# Patient Record
Sex: Male | Born: 1965 | Race: White | Hispanic: No | Smoking: Never smoker
Health system: Southern US, Community
[De-identification: ages and names within clinical notes are randomized; demographics above are authoritative.]

## PROBLEM LIST (undated history)

## (undated) DIAGNOSIS — M109 Gout, unspecified: Secondary | ICD-10-CM

## (undated) DIAGNOSIS — I10 Essential (primary) hypertension: Secondary | ICD-10-CM

## (undated) DIAGNOSIS — G4710 Hypersomnia, unspecified: Secondary | ICD-10-CM

## (undated) DIAGNOSIS — N200 Calculus of kidney: Secondary | ICD-10-CM

## (undated) DIAGNOSIS — E7800 Pure hypercholesterolemia, unspecified: Secondary | ICD-10-CM

## (undated) DIAGNOSIS — G4733 Obstructive sleep apnea (adult) (pediatric): Secondary | ICD-10-CM

## (undated) DIAGNOSIS — K219 Gastro-esophageal reflux disease without esophagitis: Secondary | ICD-10-CM

## (undated) HISTORY — PX: ESOPHAGEAL DILATION: SHX303

## (undated) HISTORY — DX: Gout, unspecified: M10.9

## (undated) HISTORY — DX: Calculus of kidney: N20.0

## (undated) HISTORY — DX: Obstructive sleep apnea (adult) (pediatric): G47.33

## (undated) HISTORY — DX: Hypersomnia, unspecified: G47.10

---

## 2000-05-23 ENCOUNTER — Emergency Department (HOSPITAL_COMMUNITY): Admission: EM | Admit: 2000-05-23 | Discharge: 2000-05-23 | Payer: Self-pay | Admitting: Emergency Medicine

## 2000-05-23 ENCOUNTER — Encounter: Payer: Self-pay | Admitting: Emergency Medicine

## 2000-05-25 ENCOUNTER — Encounter (INDEPENDENT_AMBULATORY_CARE_PROVIDER_SITE_OTHER): Payer: Self-pay

## 2000-05-25 ENCOUNTER — Other Ambulatory Visit: Admission: RE | Admit: 2000-05-25 | Discharge: 2000-05-25 | Payer: Self-pay | Admitting: Urology

## 2004-05-24 ENCOUNTER — Emergency Department (HOSPITAL_COMMUNITY): Admission: EM | Admit: 2004-05-24 | Discharge: 2004-05-24 | Payer: Self-pay | Admitting: Emergency Medicine

## 2004-05-27 ENCOUNTER — Ambulatory Visit (HOSPITAL_COMMUNITY): Admission: RE | Admit: 2004-05-27 | Discharge: 2004-05-27 | Payer: Self-pay | Admitting: Orthopedic Surgery

## 2004-05-30 ENCOUNTER — Ambulatory Visit (HOSPITAL_COMMUNITY): Admission: RE | Admit: 2004-05-30 | Discharge: 2004-05-30 | Payer: Self-pay | Admitting: Orthopedic Surgery

## 2006-11-29 ENCOUNTER — Ambulatory Visit (HOSPITAL_COMMUNITY): Admission: RE | Admit: 2006-11-29 | Discharge: 2006-11-29 | Payer: Self-pay | Admitting: Family Medicine

## 2010-05-30 ENCOUNTER — Encounter: Payer: Self-pay | Admitting: Orthopedic Surgery

## 2010-09-25 NOTE — Op Note (Signed)
NAMEGENESIS, PAGET NO.:  [id number]   MEDICAL RECORD NO.:  [id number]          PATIENT TYPE:  OIB   LOCATION:  2899                         FACILITY:  [facility]   PHYSICIAN:  Dionne Ano. Gramig III, M.D.DATE OF BIRTH:  29-Aug-1965   DATE OF PROCEDURE:  DATE OF DISCHARGE:                                 OPERATIVE REPORT   PREOPERATIVE DIAGNOSIS:  Comminuted, complex, intra-articular PIP joint  fracture about the left small finger with metaphyseal defect.   POSTOPERATIVE DIAGNOSIS:  Comminuted, complex, intra-articular proximal  interphalangeal joint fracture about the left small finger with metaphyseal  defect.   PROCEDURE:  1.  Open reduction internal fixation with bone graft (mini-Mig), left small      finger, proximal interphalangeal interarticular fracture.  2.  Dynamic external fixator apparatus application to the small finger (this      was application of a Dynamic external fixation device which was a      distinct and separate portion of the procedure).  3.  Stress radiography.   SURGEON:  Dionne Ano. Amanda Pea, M.D.   ASSISTANT:  None.   COMPLICATIONS:  None.   ANESTHESIA:  General.   TOURNIQUET TIME:  Less than 30 minutes.   ESTIMATED BLOOD LOSS:  Minimal.   INDICATIONS FOR PROCEDURE:  This patient is a very pleasant, 45 year old  male who presents for the above-mentioned diagnosis.  After counseling  regarding the risks and benefits of surgery, including risks to the patient  of infection, bleeding, anesthesia, damage to normal structures and failure  of the surgery to accomplished these intended goals, including relieving  symptoms and restoring function; with this in mind, he desires to proceed.  All questions have been encouraged and answered preoperatively.   OPERATIVE FINDINGS/PROCEDURE:  Mr. Cordial was seen in the preop holding area,  consent was signed.  Following this, he was  taken to the operating suite,  preoperative antibiotics were  given, he was laid supine, given a general  anesthetic under the direction of Dr. Gypsy Balsam.  Once this ws done, he was  prepped and draped in the usual sterile fashion about the left small finger.  Following this, the patient then underwent sterile field application and  fluoroscopy of the finger was accomplished. This was stress radiography  revealing the fracture which was interarticular and comminuted. It was quite  apparent that this was a very severe fracture and correlated well with his  plain film x-rays and CT scan.   At this time, I placed a 0.045 long, K-wire at the epicenter of his proximal  phalanx, condylar head under stress radiography.  I placed a second pin  distal in the middle phalanx towards the condylar surface. Once this was  done, I then bent these to 90 degrees and then bent the most proximal pin  to conform to the parameters for Dynamic extra fix.  The patient was bent at  120, then 60 degrees at the distal end of the most proximal pin to allow for  engagement of the distal pin. These were then bent at 90 degrees, hooked  together  as described for the external fixator technique, and allowed for  external fixation with a Dynamic component that would allow full flexion and  extension.  I was pleased with this. All looked well and lined up nicely  radiographically. This was a 0.045 K-wire which was used, of course.   Once this was done, the patient had a small incision made under tourniquet  control dorsally about the middle phalanx. I entered a pilot hole with the  0.062 K-wire and then using a blunt-tipped 0.045 K-wire, performed reduction  of the fracture. Following this, I mixed mini-Mig bone graft which is  synthetic calcium sulfate and filled the gap and metaphyseal defect with  this.  Thus, ORIF with bone grafting utilizing mini-Mig synthetic graft and  external fixation was applied.  The stress radiography afterwards showed  excellent position. The tourniquet was  deflated, hemostasis was obtained.  The wound was closed. A pin cap was then placed, and Xeroform as well as  Neosporin were placed.  I placed a Marcaine block for postop analgesia and  following this, the patient was transferred to recovery room where he was  noted to be in stable condition.   He will be monitored and discharged home on Keflex, Percocet, Robaxin.  Follow-up in 4-5 days.  Notify me if he has any problems occur. I gave him  an additional g of Ancef in the recovery room, and asked him to notify me  should any problems, questions or concerns arise.  All questions have been  encouraged and answered.       WMG/MEDQ  D:  05/30/2004  T:  05/30/2004  Job:  [id number]

## 2011-09-04 ENCOUNTER — Emergency Department (HOSPITAL_COMMUNITY)
Admission: EM | Admit: 2011-09-04 | Discharge: 2011-09-04 | Disposition: A | Discharge status: Home / Self Care | Payer: BC Managed Care – PPO | Attending: Emergency Medicine | Admitting: Emergency Medicine

## 2011-09-04 ENCOUNTER — Encounter (HOSPITAL_COMMUNITY): Payer: Self-pay | Admitting: *Deleted

## 2011-09-04 DIAGNOSIS — W261XXA Contact with sword or dagger, initial encounter: Secondary | ICD-10-CM | POA: Insufficient documentation

## 2011-09-04 DIAGNOSIS — S61209A Unspecified open wound of unspecified finger without damage to nail, initial encounter: Secondary | ICD-10-CM | POA: Insufficient documentation

## 2011-09-04 DIAGNOSIS — I10 Essential (primary) hypertension: Secondary | ICD-10-CM | POA: Insufficient documentation

## 2011-09-04 DIAGNOSIS — W260XXA Contact with knife, initial encounter: Secondary | ICD-10-CM | POA: Insufficient documentation

## 2011-09-04 DIAGNOSIS — K219 Gastro-esophageal reflux disease without esophagitis: Secondary | ICD-10-CM | POA: Insufficient documentation

## 2011-09-04 DIAGNOSIS — IMO0002 Reserved for concepts with insufficient information to code with codable children: Secondary | ICD-10-CM

## 2011-09-04 DIAGNOSIS — E789 Disorder of lipoprotein metabolism, unspecified: Secondary | ICD-10-CM | POA: Insufficient documentation

## 2011-09-04 HISTORY — DX: Pure hypercholesterolemia, unspecified: E78.00

## 2011-09-04 HISTORY — DX: Essential (primary) hypertension: I10

## 2011-09-04 HISTORY — DX: Gastro-esophageal reflux disease without esophagitis: K21.9

## 2011-09-04 NOTE — Discharge Instructions (Signed)
Laceration Care, Adult °A laceration is a cut that goes through all layers of the skin. The cut goes into the tissue beneath the skin. °HOME CARE °For stitches (sutures) or staples: °· Keep the cut clean and dry.  °· If you have a bandage (dressing), change it at least once a day. Change the bandage if it gets wet or dirty, or as told by your doctor.  °· Wash the cut with soap and water 2 times a day. Rinse the cut with water. Pat it dry with a clean towel.  °· Put a thin layer of medicated cream on the cut as told by your doctor.  °· You may shower after the first 24 hours. Do not soak the cut in water until the stitches are removed.  °· Only take medicines as told by your doctor.  °· Have your stitches or staples removed as told by your doctor.  °For skin adhesive strips: °· Keep the cut clean and dry.  °· Do not get the strips wet. You may take a bath, but be careful to keep the cut dry.  °· If the cut gets wet, pat it dry with a clean towel.  °· The strips will fall off on their own. Do not remove the strips that are still stuck to the cut.  °For wound glue: °· You may shower or take baths. Do not soak or scrub the cut. Do not swim. Avoid heavy sweating until the glue falls off on its own. After a shower or bath, pat the cut dry with a clean towel.  °· Do not put medicine on your cut until the glue falls off.  °· If you have a bandage, do not put tape over the glue.  °· Avoid lots of sunlight or tanning lamps until the glue falls off. Put sunscreen on the cut for the first year to reduce your scar.  °· The glue will fall off on its own. Do not pick at the glue.  °You may need a tetanus shot if: °· You cannot remember when you had your last tetanus shot.  °· You have never had a tetanus shot.  °If you need a tetanus shot and you choose not to have one, you may get tetanus. Sickness from tetanus can be serious. °GET HELP RIGHT AWAY IF:  °· Your pain does not get better with medicine.  °· Your arm, hand, leg, or  foot loses feeling (numbness) or changes color.  °· Your cut is bleeding.  °· Your joint feels weak, or you cannot use your joint.  °· You have painful lumps on your body.  °· Your cut is red, puffy (swollen), or painful.  °· You have a red line on the skin near the cut.  °· You have yellowish-white fluid (pus) coming from the cut.  °· You have a fever.  °· You have a bad smell coming from the cut or bandage.  °· Your cut breaks open before or after stitches are removed.  °· You notice something coming out of the cut, such as wood or glass.  °· You cannot move a finger or toe.  °MAKE SURE YOU:  °· Understand these instructions.  °· Will watch your condition.  °· Will get help right away if you are not doing well or get worse.  °Document Released: 10/13/2007 Document Revised: 04/15/2011 Document Reviewed: 10/20/2010 °Exit[employer]® Patient Information ©2012 [employer]. °

## 2011-09-04 NOTE — ED Notes (Signed)
Cut right ring finger with box cutter.  Bleeding controlled.

## 2011-09-04 NOTE — ED Provider Notes (Signed)
This chart was scribed for [employer]. Colon Branch, MD by Wallis Mart. The patient was seen in room APA03/APA03 and the patient's care was started at 6:55 PM.    CSN: [id number]  Arrival date & time 09/04/11  1827   First MD Initiated Contact with Patient 09/04/11 1840      Chief Complaint  Patient presents with  . Extremity Laceration    (Consider location/radiation/quality/duration/timing/severity/associated sxs/prior treatment) HPI Stephen Sellers is a 46 y.o. male who presents to the Emergency Department complaining of a laceration to right ring finger onset this afternoon. Pt states that he cut his finger on a box cutter while cutting panelling. There are no other associated symptoms and no other alleviating or aggravating factors.    Past Medical History  Diagnosis Date  . Acid reflux   . High cholesterol   . Hypertension     History reviewed. No pertinent past surgical history.  No family history on file.  History  Substance Use Topics  . Smoking status: Never Smoker   . Smokeless tobacco: Not on file  . Alcohol Use: No      Review of Systems 10 Systems reviewed and all are negative for acute change except as noted in the HPI.   Allergies  Review of patient's allergies indicates no known allergies.  Home Medications  No current outpatient prescriptions on file.  BP 114/48  Pulse 54  Temp(Src) 98.1 F (36.7 C) (Oral)  Resp 16  Ht 5\' 5"  (1.651 m)  Wt 160 lb (72.576 kg)  BMI 26.63 kg/m2  SpO2 96%  Physical Exam  Constitutional: He is oriented to person, place, and time. He appears well-developed and well-nourished.  Eyes: Pupils are equal, round, and reactive to light.  Neck: Normal range of motion.  Musculoskeletal:       10 mm laceration to right ring finger that starts at edge of nail and goes around half way to pad of finger, 6 mm laceration to bottom of right ring finger, lacerations are not deep  Neurological: He is alert and oriented to person,  place, and time.  Psychiatric: He has a normal mood and affect. His behavior is normal.    ED Course  Procedures (including critical care time) DIAGNOSTIC STUDIES: Oxygen Saturation is 96% on room air, normal by my interpretation.    COORDINATION OF CARE:  7:12 PM: EDP at bedside to recheck laceration after it has soaked.       MDM  Patient was to lacerations to the ring finger from using a box cutter. Tetanus is up-to-date. Dermabond and Steri-Strips placed by nursing.Pt stable in ED with no significant deterioration in condition.The patient appears reasonably screened and/or stabilized for discharge and I doubt any other medical condition or other [facility] requiring further screening, evaluation, or treatment in the ED at this time prior to discharge.  I personally performed the services described in this documentation, which was scribed in my presence. The recorded information has been reviewed and considered.   MDM Reviewed: nursing note and vitals           Nicoletta Dress. Colon Branch, MD 09/04/11 1929

## 2011-09-04 NOTE — ED Notes (Signed)
Discharge instructions reviewed with pt, questions answered. Pt verbalized understanding.  

## 2017-01-31 ENCOUNTER — Ambulatory Visit (INDEPENDENT_AMBULATORY_CARE_PROVIDER_SITE_OTHER): Payer: BLUE CROSS/BLUE SHIELD | Admitting: Family Medicine

## 2017-01-31 ENCOUNTER — Encounter: Payer: Self-pay | Admitting: Family Medicine

## 2017-01-31 VITALS — BP 120/80 | HR 56 | Ht 65.0 in | Wt 149.6 lb

## 2017-01-31 DIAGNOSIS — Z87442 Personal history of urinary calculi: Secondary | ICD-10-CM | POA: Diagnosis not present

## 2017-01-31 DIAGNOSIS — E785 Hyperlipidemia, unspecified: Secondary | ICD-10-CM | POA: Diagnosis not present

## 2017-01-31 DIAGNOSIS — M109 Gout, unspecified: Secondary | ICD-10-CM | POA: Insufficient documentation

## 2017-01-31 DIAGNOSIS — Z8739 Personal history of other diseases of the musculoskeletal system and connective tissue: Secondary | ICD-10-CM

## 2017-01-31 DIAGNOSIS — K219 Gastro-esophageal reflux disease without esophagitis: Secondary | ICD-10-CM | POA: Diagnosis not present

## 2017-01-31 MED ORDER — ESOMEPRAZOLE MAGNESIUM 40 MG PO CPDR: 40.0000 mg | DELAYED_RELEASE_CAPSULE | Freq: Every day | ORAL | 3 refills | Status: AC

## 2017-01-31 NOTE — Progress Notes (Signed)
   Subjective:    Patient ID: Stephen Sellers, male    DOB: Dec 11, 1965, 51 y.o.   MRN: [id number]  HPI Chief Complaint  Patient presents with  . new pt    new pt get established. needs a refill on nexium. will get flu shot at work   He is new to the practice and here to establish care.  Previous medical care: Dr. Margo Aye in [place]. States he just moved to [place].   History of GERD and esophageal stricture in the past and had his esophagus dilated years ago. No difficulty swallowing since. States he takes Nexium daily or reflux is unmanageable. GI- Dr. Kinnie Scales.   States he eats sweets and drinks a lot of coffee. Diet is unhealthy.   2 step daughters. Currently separated. Works for [employer] as [employer].   History of gout attacks-last one was 2 years ago. States he avoids shellfish. Denies taking preventive medication for this. He has taken colchicine in the past for acute attacks.  Reports history of kidney stones- last passed a stone 6 weeks ago.  He has a urologist, thinks this is at [facility].    History of hyperlipidemia. Last checked in July through work. States he has tried statins in the past and this has caused him to have joint pains.   Denies fever, chills, dizziness, chest pain, palpitations, shortness of breath, abdominal pain, N/V/D, urinary symptoms.     Reviewed allergies, medications, past medical, surgical, and social history.   Review of Systems Pertinent positives and negatives in the history of present illness.     Objective:   Physical Exam BP 120/80   Pulse (!) 56   Ht  (1.651 m)   Wt 149 lb 9.6 oz (67.9 kg)   BMI 24.89 kg/m   Alert and oriented and in no acute distress. Not otherwise examined.       Assessment & Plan:  Gastroesophageal reflux disease, esophagitis presence not specified - Plan: esomeprazole (NEXIUM) 40 MG capsule  Hyperlipidemia, unspecified hyperlipidemia type  History of kidney stones  History of gout  Nexium  refilled. Counseled on lifestyle management.  Will attempt to get medical records from his previous PCP.  He will return for a fasting CPE at his convenience.

## 2017-03-18 ENCOUNTER — Telehealth: Payer: Self-pay | Admitting: Family Medicine

## 2017-03-18 NOTE — Telephone Encounter (Signed)
Rcvd office notes and labs from Dr Dwana MelenaZack Hall

## 2017-04-04 ENCOUNTER — Encounter: Payer: Self-pay | Admitting: Family Medicine

## 2017-04-04 DIAGNOSIS — G4710 Hypersomnia, unspecified: Secondary | ICD-10-CM

## 2017-04-04 DIAGNOSIS — I10 Essential (primary) hypertension: Secondary | ICD-10-CM

## 2017-04-04 DIAGNOSIS — G4733 Obstructive sleep apnea (adult) (pediatric): Secondary | ICD-10-CM

## 2017-04-04 HISTORY — DX: Hypersomnia, unspecified: G47.10

## 2017-04-04 HISTORY — DX: Obstructive sleep apnea (adult) (pediatric): G47.33

## 2017-04-04 HISTORY — DX: Essential (primary) hypertension: I10

## 2017-04-05 ENCOUNTER — Encounter: Payer: Self-pay | Admitting: Family Medicine

## 2017-04-08 ENCOUNTER — Encounter: Payer: Self-pay | Admitting: Family Medicine

## 2018-09-28 ENCOUNTER — Telehealth: Payer: Self-pay | Admitting: Internal Medicine

## 2018-09-28 NOTE — Telephone Encounter (Signed)
Left message for pt to call back to schedule CPE 

## 2019-03-09 ENCOUNTER — Telehealth: Payer: Self-pay | Admitting: Internal Medicine

## 2019-03-09 NOTE — Telephone Encounter (Signed)
[place] working 2nd shift [place] and will be going back to first soon so he can't schedule anything due to his schedule. Will call back soon to schedule cpe

## 2020-11-07 ENCOUNTER — Other Ambulatory Visit: Payer: Self-pay

## 2020-11-07 ENCOUNTER — Encounter (HOSPITAL_COMMUNITY): Payer: Self-pay | Admitting: Emergency Medicine

## 2020-11-07 ENCOUNTER — Emergency Department (HOSPITAL_COMMUNITY)
Admission: EM | Admit: 2020-11-07 | Discharge: 2020-11-08 | Disposition: A | Discharge status: Home / Self Care | Payer: 59 | Attending: Emergency Medicine | Admitting: Emergency Medicine

## 2020-11-07 ENCOUNTER — Emergency Department (HOSPITAL_COMMUNITY): Payer: 59

## 2020-11-07 DIAGNOSIS — I10 Essential (primary) hypertension: Secondary | ICD-10-CM | POA: Diagnosis not present

## 2020-11-07 DIAGNOSIS — Z23 Encounter for immunization: Secondary | ICD-10-CM | POA: Insufficient documentation

## 2020-11-07 DIAGNOSIS — S61411A Laceration without foreign body of right hand, initial encounter: Secondary | ICD-10-CM | POA: Diagnosis not present

## 2020-11-07 DIAGNOSIS — S61419A Laceration without foreign body of unspecified hand, initial encounter: Secondary | ICD-10-CM

## 2020-11-07 DIAGNOSIS — W208XXA Other cause of strike by thrown, projected or falling object, initial encounter: Secondary | ICD-10-CM | POA: Insufficient documentation

## 2020-11-07 DIAGNOSIS — Y9389 Activity, other specified: Secondary | ICD-10-CM | POA: Insufficient documentation

## 2020-11-07 DIAGNOSIS — S61412A Laceration without foreign body of left hand, initial encounter: Secondary | ICD-10-CM | POA: Diagnosis not present

## 2020-11-07 DIAGNOSIS — R202 Paresthesia of skin: Secondary | ICD-10-CM | POA: Insufficient documentation

## 2020-11-07 MED ORDER — LIDOCAINE HCL (PF) 1 % IJ SOLN
20.0000 mL | Freq: Once | INTRAMUSCULAR | Status: AC
  Administered 2020-11-07: 20 mL
  Filled 2020-11-07: qty 30

## 2020-11-07 MED ORDER — TETANUS-DIPHTH-ACELL PERTUSSIS 5-2.5-18.5 LF-MCG/0.5 IM SUSY
0.5000 mL | PREFILLED_SYRINGE | Freq: Once | INTRAMUSCULAR | Status: AC
  Administered 2020-11-07: 0.5 mL via INTRAMUSCULAR
  Filled 2020-11-07: qty 0.5

## 2020-11-07 MED ORDER — OXYCODONE-ACETAMINOPHEN 5-325 MG PO TABS
1.0000 | ORAL_TABLET | Freq: Once | ORAL | Status: AC
  Administered 2020-11-07: 1 via ORAL
  Filled 2020-11-07: qty 1

## 2020-11-07 NOTE — ED Provider Notes (Signed)
[facility]-EMERGENCY DEPT Provider Note   CSN: [id number] Arrival date & time: 11/07/20  2102     History Chief Complaint  Patient presents with   Laceration    STEFFON GLADU is a 55 y.o. male who presents with several bilateral hand lacerations. Reports a porcelain kitchen sink falling on his hands when he was trying to repair it. Denies syncopal episode or feeling lightheaded. Denies taking any blood thinners. Rates his pain a 3/10. Endorses loss of sensation of his left 4th finger.   HPI     Past Medical History:  Diagnosis Date   Acid reflux    Gout    High cholesterol    Hypersomnia 04/04/2017   Hypertension    Hypertension 04/04/2017   Kidney stones    OSA (obstructive sleep apnea) 04/04/2017   Improved with weigh loss per Dr. Margo Aye records    Patient Active Problem List   Diagnosis Date Noted   Hypertension 04/04/2017   Hypersomnia 04/04/2017   OSA (obstructive sleep apnea) 04/04/2017   Acid reflux    Gout     Past Surgical History:  Procedure Laterality Date   ESOPHAGEAL DILATION     Dr. Kinnie Scales       Family History  Problem Relation Age of Onset   Diabetes Mother    Hypertension Mother    Hyperlipidemia Mother    Esophageal cancer Father    Throat cancer Maternal Grandfather     Social History   Tobacco Use   Smoking status: Never   Smokeless tobacco: Never  Substance Use Topics   Alcohol use: No   Drug use: No    Home Medications Prior to Admission medications   Medication Sig Start Date End Date Taking? Authorizing Provider  esomeprazole (NEXIUM) 40 MG capsule Take 1 capsule (40 mg total) by mouth daily. 01/31/17   Henson, Vickie L, NP-C  Multiple Vitamin (MULTIVITAMIN) tablet Take 1 tablet by mouth daily.    [provider]    Allergies    Patient has no known allergies.  Review of Systems   Review of Systems  Skin:  Positive for wound.  Neurological:  Positive for numbness.  All other systems  reviewed and are negative.  Physical Exam Updated Vital Signs BP (!) 137/94   Pulse 64   Temp 98.2 F (36.8 C) (Oral)   Resp 18   Ht 5\' 5"  (1.651 m)   Wt 68 kg   SpO2 98%   BMI 24.96 kg/m   Physical Exam Constitutional:      Appearance: Normal appearance.  HENT:     Head: Normocephalic and atraumatic.     Mouth/Throat:     Mouth: Mucous membranes are moist.  Eyes:     Extraocular Movements: Extraocular movements intact.     Conjunctiva/sclera: Conjunctivae normal.  Cardiovascular:     Rate and Rhythm: Normal rate and regular rhythm.     Pulses: Normal pulses.     Heart sounds: Normal heart sounds.  Pulmonary:     Effort: Pulmonary effort is normal.     Breath sounds: Normal breath sounds.  Abdominal:     General: There is no distension.     Palpations: Abdomen is soft.  Musculoskeletal:        General: Tenderness, deformity and signs of injury present.  Skin:    Findings: Erythema present.     Comments: Several lacerations of hands bilaterally. Laceration on L lateral hand with muscle exposed   Neurological:  Mental Status: He is alert.    ED Results / Procedures / Treatments   Labs (all labs ordered are listed, but only abnormal results are displayed) Labs Reviewed - No data to display  EKG None  Radiology DG Hand Complete Left  Result Date: 11/07/2020 CLINICAL DATA:  Lacerations to the left hand after injury. EXAM: LEFT HAND - COMPLETE 3+ VIEW COMPARISON:  05/24/2004 FINDINGS: Degenerative changes in the interphalangeal joints. No evidence of acute fracture or dislocation. No focal bone lesion or bone destruction. Soft tissue irregularity along the proximal aspect of the fourth finger consistent with soft tissue injury. No radiopaque foreign bodies are identified. IMPRESSION: No acute bony abnormalities.  Degenerative changes. Electronically Signed   By: Burman Nieves M.D.   On: 11/07/2020 23:10    Procedures Procedures   Medications Ordered in  ED Medications  lidocaine (PF) (XYLOCAINE) 1 % injection 20 mL (20 mLs Infiltration Given by Other 11/07/20 2304)  oxyCODONE-acetaminophen (PERCOCET/ROXICET) 5-325 MG per tablet 1 tablet (1 tablet Oral Given 11/07/20 2302)  Tdap (BOOSTRIX) injection 0.5 mL (0.5 mLs Intramuscular Given 11/07/20 2302)    ED Course  I have reviewed the triage vital signs and the nursing notes.  Pertinent labs & imaging results that were available during my care of the patient were reviewed by me and considered in my medical decision making (see chart for details).    MDM Rules/Calculators/A&P                          Kaniel Kiang is a 55 year old who presents with several bilateral hand lacerations from a porcelain kitchen sink breaking on his hands.  XR without any acute bony abnormalities or foreign bodies. Lacerations (5) were repaired. Patient was instructed to follow up to Ortho Dr. Flonnie Overman (who has worked with patient previously) next week.    Final Clinical Impression(s) / ED Diagnoses Final diagnoses:  Laceration of hand without foreign body, unspecified laterality, initial encounter    Rx / DC Orders ED Discharge Orders     None        Cora Collum, DO 11/08/20 0031    Milagros Loll, MD 11/08/20 [phone]

## 2020-11-07 NOTE — ED Triage Notes (Signed)
Patient here from home reporting multiple lacerations to bilateral hands after sink dropped on hands. Bleeding. Denies blood thinners.

## 2020-11-08 NOTE — Discharge Instructions (Signed)
Follow up with Dr. Amanda Pea on Tuesday for hand evaluation.  Return for any fevers/ concern for infection from hand wounds

## 2020-11-08 NOTE — ED Provider Notes (Signed)
..  Laceration Repair  Date/Time: 11/08/2020 12:35 AM Performed by: Milagros Loll, MD Authorized by: Milagros Loll, MD   Consent:    Consent obtained:  Verbal   Consent given by:  Patient   Risks, benefits, and alternatives were discussed: yes     Risks discussed:  Infection, need for additional repair, nerve damage, poor wound healing, poor cosmetic result, pain, retained foreign body, tendon damage and vascular damage   Alternatives discussed:  No treatment, delayed treatment, observation and referral Universal protocol:    Immediately prior to procedure, a time out was called: yes     Patient identity confirmed:  Verbally with patient Anesthesia:    Anesthesia method:  Local infiltration and nerve block   Local anesthetic:  Lidocaine 1% w/o epi   Block location:  Left ring finger and right index finger   Block needle gauge:  25 G   Block anesthetic:  Lidocaine 1% w/o epi   Block injection procedure:  Anatomic landmarks identified, anatomic landmarks palpated, introduced needle and negative aspiration for blood   Block outcome:  Anesthesia achieved Laceration details:    Location:  Hand   Hand location: L lateral palm (3cm), L ring finger (3cm), L base of thumb (2cm), R index finger (1cm), R wrist (1cm)   Length (cm):  10 Exploration:    Wound exploration: wound explored through full range of motion     Wound extent: nerve damage     Wound extent: no fascia violation noted, no foreign bodies/material noted, no muscle damage noted, no tendon damage noted, no underlying fracture noted and no vascular damage noted     Contaminated: no   Treatment:    Area cleansed with:  Povidone-iodine and saline   Amount of cleaning:  Extensive   Irrigation solution:  Sterile saline   Irrigation method:  Syringe   Visualized foreign bodies/material removed: no     Debridement:  None   Undermining:  None   Scar revision: no   Skin repair:    Repair method:  Sutures   Suture size:   5-0   Suture material:  Nylon   Suture technique:  Simple interrupted   Number of sutures:  16 Approximation:    Approximation:  Close Repair type:    Repair type:  Intermediate Post-procedure details:    Dressing:  Tube gauze and non-adherent dressing (xeroform gauze)   Procedure completion:  Tolerated well, no immediate complications    Milagros Loll, MD 11/08/20 0040

## 2022-12-28 IMAGING — DX DG HAND COMPLETE 3+V*L*
3 series · 3 of 3 positions shown · non-contrast
Comparison: 05/24/2004

CLINICAL DATA: Lacerations to the left hand after injury.

EXAM:
LEFT HAND - COMPLETE 3+ VIEW

[hand ap]
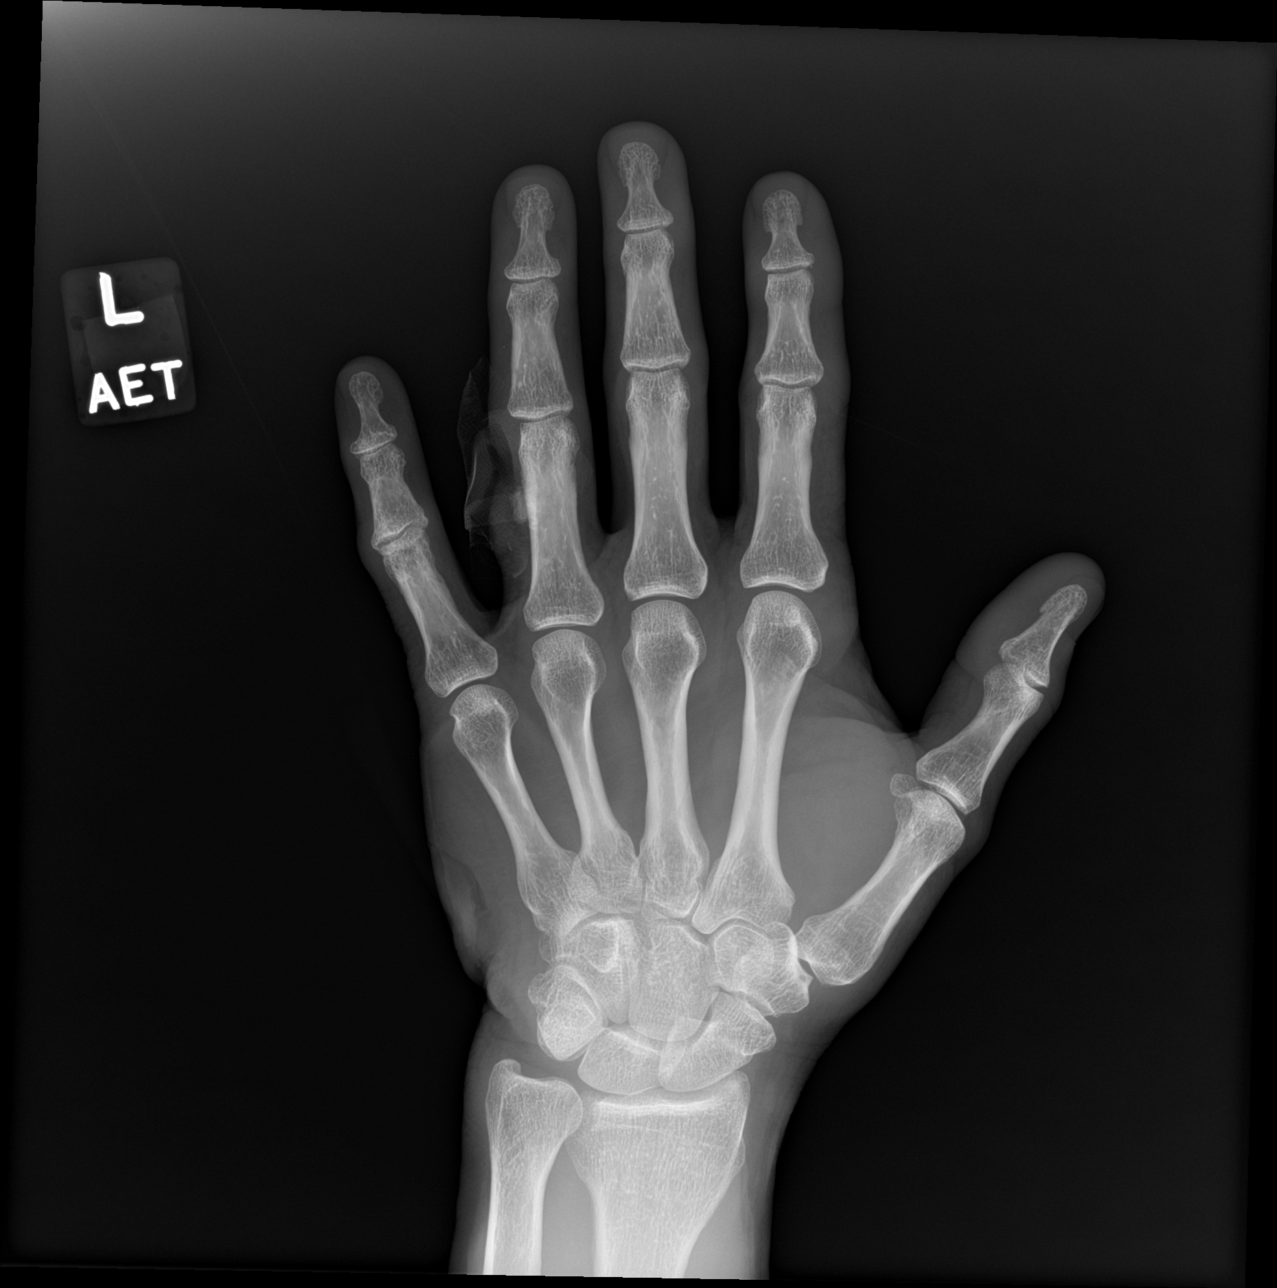

[hand obl]
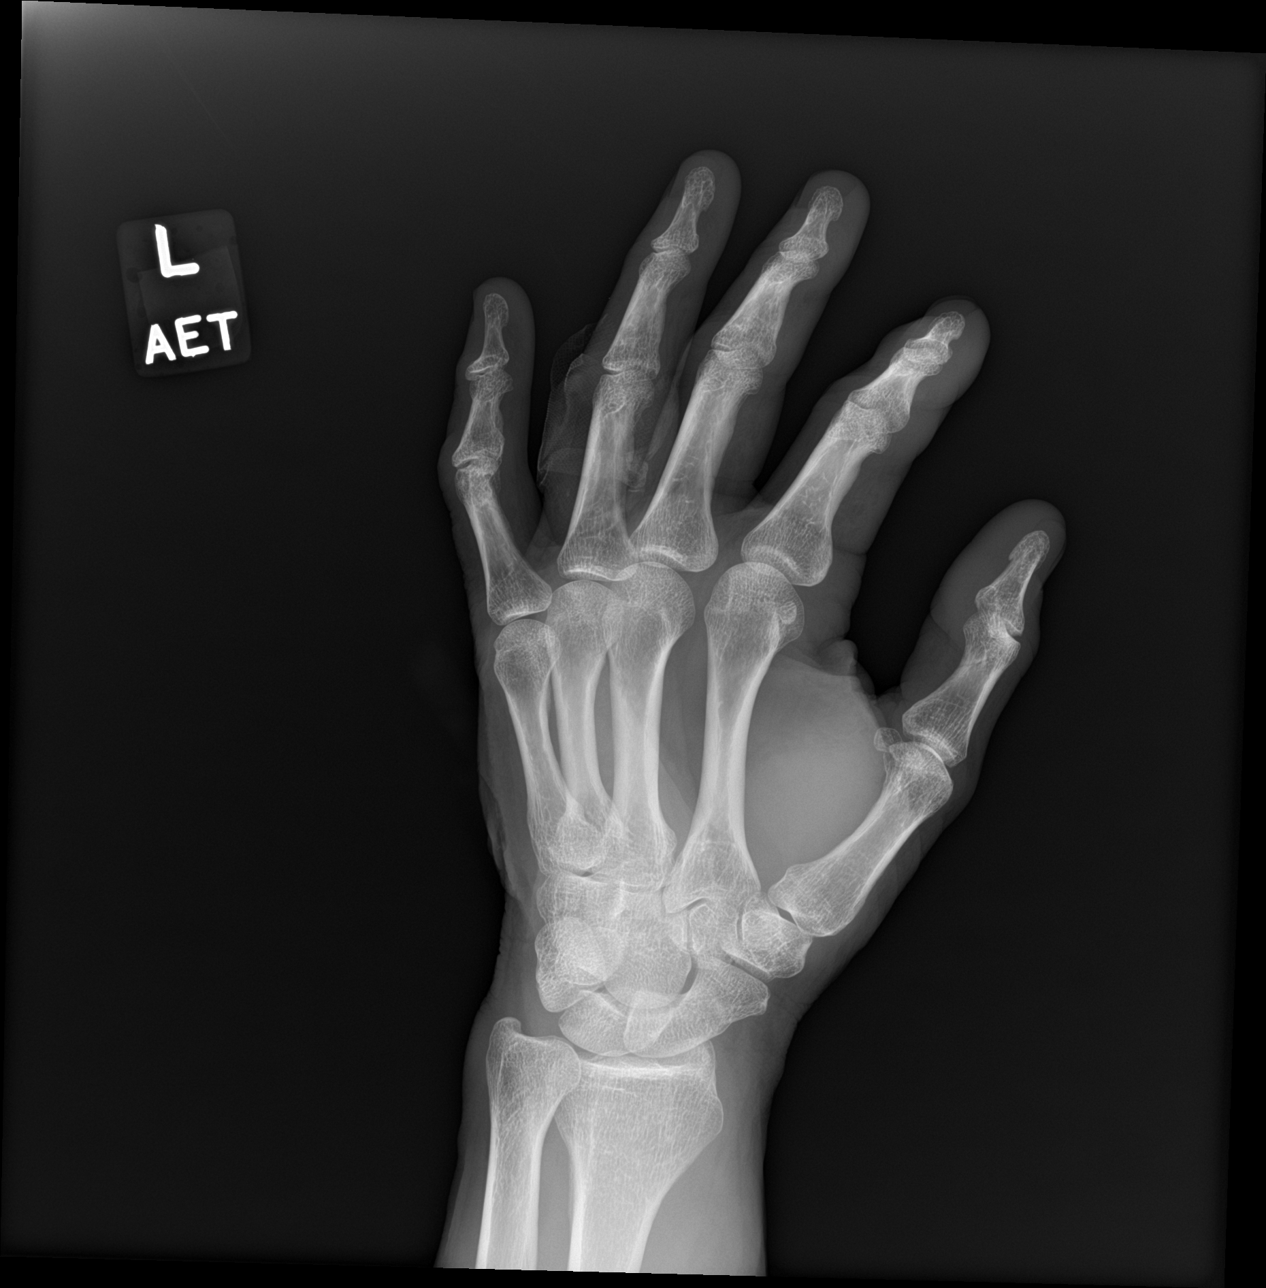

[hand lat]
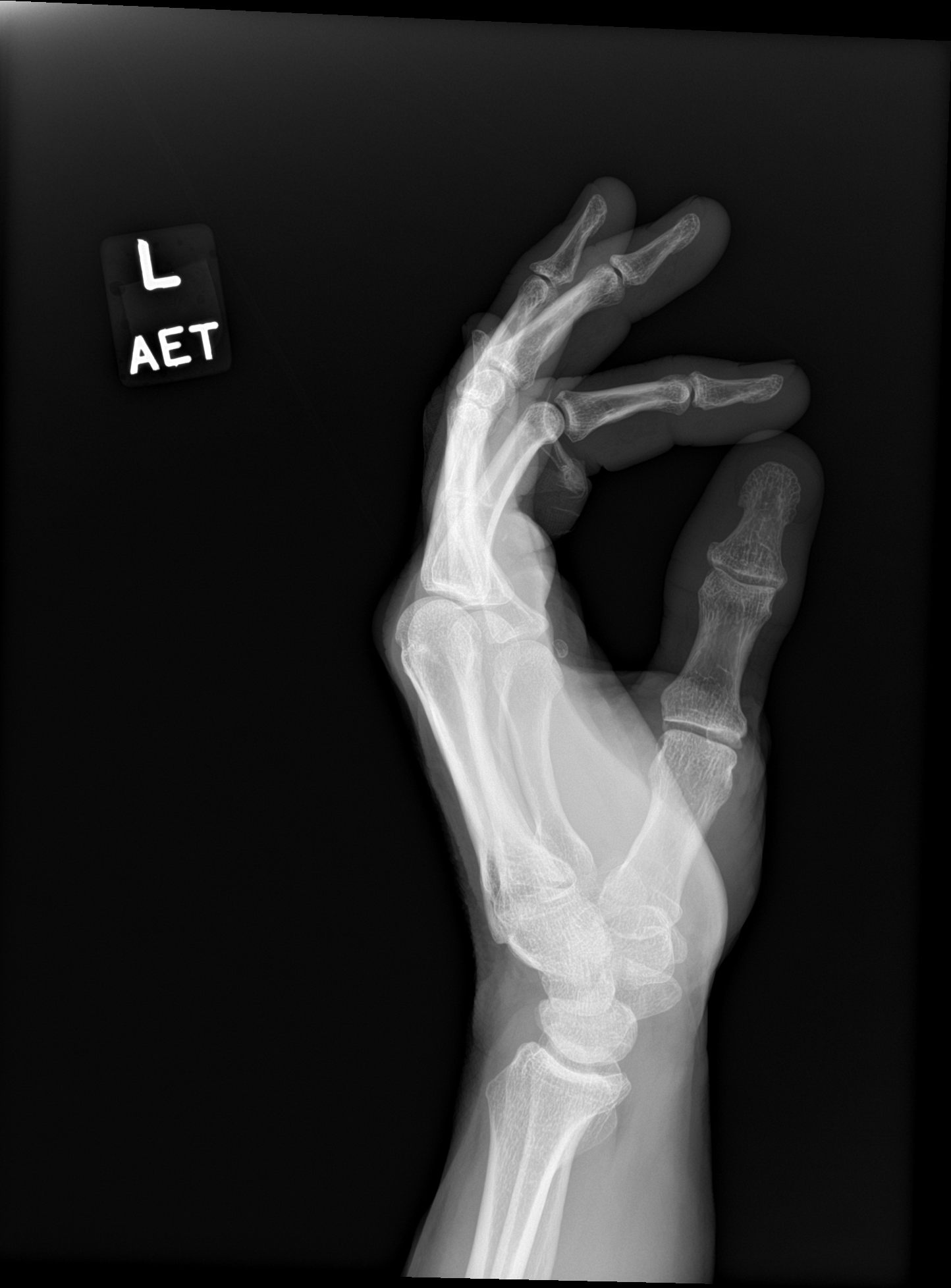

[3 of 3 positions shown; findings below may reference images not displayed]

FINDINGS: Degenerative changes in the interphalangeal joints. No evidence of
acute fracture or dislocation. No focal bone lesion or bone
destruction. Soft tissue irregularity along the proximal aspect of
the fourth finger consistent with soft tissue injury. No radiopaque
foreign bodies are identified.
IMPRESSION: No acute bony abnormalities.  Degenerative changes.
